# Patient Record
Sex: Female | Born: 1990 | Race: White | Hispanic: No | Marital: Single | Smoking: Current every day smoker
Health system: Southern US, Community
[De-identification: ages and names within clinical notes are randomized; demographics above are authoritative.]

---

## 2016-03-16 ENCOUNTER — Emergency Department (HOSPITAL_COMMUNITY)
Admission: EM | Admit: 2016-03-16 | Discharge: 2016-03-16 | Disposition: A | Discharge status: Home / Self Care | Payer: Medicaid Other | Attending: Emergency Medicine | Admitting: Emergency Medicine

## 2016-03-16 ENCOUNTER — Encounter (HOSPITAL_COMMUNITY): Payer: Self-pay

## 2016-03-16 ENCOUNTER — Emergency Department (HOSPITAL_COMMUNITY): Payer: Medicaid Other

## 2016-03-16 DIAGNOSIS — Y9289 Other specified places as the place of occurrence of the external cause: Secondary | ICD-10-CM | POA: Insufficient documentation

## 2016-03-16 DIAGNOSIS — Y9389 Activity, other specified: Secondary | ICD-10-CM | POA: Diagnosis not present

## 2016-03-16 DIAGNOSIS — F1110 Opioid abuse, uncomplicated: Secondary | ICD-10-CM | POA: Insufficient documentation

## 2016-03-16 DIAGNOSIS — S50851A Superficial foreign body of right forearm, initial encounter: Secondary | ICD-10-CM | POA: Insufficient documentation

## 2016-03-16 DIAGNOSIS — Y998 Other external cause status: Secondary | ICD-10-CM | POA: Diagnosis not present

## 2016-03-16 DIAGNOSIS — X58XXXA Exposure to other specified factors, initial encounter: Secondary | ICD-10-CM | POA: Diagnosis not present

## 2016-03-16 DIAGNOSIS — F17210 Nicotine dependence, cigarettes, uncomplicated: Secondary | ICD-10-CM | POA: Diagnosis not present

## 2016-03-16 MED ORDER — SULFAMETHOXAZOLE-TRIMETHOPRIM 800-160 MG PO TABS: 2.0000 | ORAL_TABLET | Freq: Two times a day (BID) | ORAL | Status: AC

## 2016-03-16 NOTE — ED Notes (Signed)
Dr Clayborne DanaMesner and Norva KarvonenH Laisure, PA, in w/pt.

## 2016-03-16 NOTE — ED Provider Notes (Signed)
Medical screening examination/treatment/procedure(s) were conducted as a shared visit with non-physician practitioner(s) and myself.  I personally evaluated the patient during the encounter.  Patient was using insulin syringe to access her cephalic vein on her right arm when the needle broke off and she came here for evaluation. Ultrasound showed a foreign body approximately 6 mm deep and 6 mm long sitting on the wall of the cephalic vein. Secondary to its proximity to the vein we will discuss case with hand surgery on call and come over the best course of action.  EMERGENCY DEPARTMENT US SOFT TISSUE INTERPRETATION "Study: Limited Soft Tissue Ultrasound"  INDICATIONS: Other (refer to comments) Multiple views of the body part were obtained in real-time with a multi-frequency linear probe PERFORMED BY:  Myself IMAGES ARCHIVED?: Yes SIDE:Right  BODY PART:Upper extremity FINDINGS: Other foreign body approximately 6 mm long on anterior portion of cephalic vein INTERPRETATION:  No abcess noted and No cellulitis noted but foreign body (likely broken needle identified)   CPT:  Upper extremity [id number]    Marily MemosJason Madyson Lukach, MD 03/16/16 2309

## 2016-03-16 NOTE — ED Notes (Signed)
Patient here with needle broken off in right arm, denies pain. Broke during heroine injection

## 2016-03-16 NOTE — Discharge Instructions (Signed)
It is important to take your antibiotics to prevent infection.  I also recommended soaking the area in warm water and applying warm compresses.    Return to the Emergency Department if you develop pus like drainage from the area, increased redness or warmth of the arm, swelling of the arm, or a fever above 101.

## 2016-03-16 NOTE — ED Notes (Signed)
Pt aware of Norva KarvonenH Laisure, PA, speaking w/Ortho MD currently.

## 2016-03-16 NOTE — ED Provider Notes (Signed)
CSN: [id number]     Arrival date & time 03/16/16  1459 History  By signing my name below, I, Linna Darner, attest that this documentation has been prepared under the direction and in the presence of non-physician practitioner, Santiago Glad, PA-C. Electronically Signed: Linna Darner, Scribe. 03/16/2016. 4:54 PM.     No chief complaint on file.    The history is provided by the patient. No language interpreter was used.     HPI Comments: Allison Harding is a 25 y.o. female who presents to the Emergency Department complaining of broken needle in her right forearm s/p heroine injection about 2 hours ago. Pt reports that she came to the ER right after the incident. Pt states that she had stopped using heroine for a while but started again two days ago after a friend brought heroine to her home. She denies fever, neuro deficits, numbness of her hands, drainage from the injection site, or any other associated symptoms.  History reviewed. No pertinent past medical history. History reviewed. No pertinent past surgical history. No family history on file. Social History  Substance Use Topics  . Smoking status: Current Every Day Smoker    Types: Cigarettes  . Smokeless tobacco: None  . Alcohol Use: None   OB History    No data available     Review of Systems  A complete 10 system review of systems was obtained and all systems are negative except as noted in the HPI and PMH.   Allergies  Review of patient's allergies indicates no known allergies.  Home Medications   Prior to Admission medications   Not on File   BP 144/95 mmHg  Pulse 114  Temp(Src) 98.6 F (37 C)  Resp 18  SpO2 98%  LMP 03/16/2016 Physical Exam  Constitutional: She is oriented to person, place, and time. She appears well-developed and well-nourished. No distress.  HENT:  Head: Normocephalic and atraumatic.  Eyes: Conjunctivae and EOM are normal.  Neck: Normal range of motion. Neck supple. No tracheal deviation  present.  Cardiovascular: Normal rate and regular rhythm.   No murmur heard. Pulmonary/Chest: Effort normal. No respiratory distress.  Musculoskeletal: Normal range of motion.  Track mark to the volar aspect of the right arm; no drainage; no edema; no warmth, no surrounding erythema. Right arm: 2+ radial pulse.  FROM right elbow and right wrist. No foreign body visualized or palpated   Neurological: She is alert and oriented to person, place, and time.  Distal sensation of the right hand intact.  Skin: Skin is warm and dry.  Psychiatric: She has a normal mood and affect. Her behavior is normal.  Nursing note and vitals reviewed.  ED Course  Procedures (including critical care time)  DIAGNOSTIC STUDIES: Oxygen Saturation is 98% on RA, normal by my interpretation.    COORDINATION OF CARE: 4:54 PM Discussed treatment plan with pt at bedside and pt agreed to plan.  Labs Review Labs Reviewed - No data to display  Imaging Review Dg Forearm Right  03/16/2016  CLINICAL DATA:  Needle broken off in right forearm. EXAM: RIGHT FOREARM - 2 VIEW COMPARISON:  None. FINDINGS: There is no evidence of fracture or other focal bone lesions. A small linear radiopaque foreign body is seen in the superficial subcutaneous tissues within the volar aspect of the midforearm. IMPRESSION: Small linear radiopaque foreign body in subcutaneous tissues in the volar aspect of the mid forearm. No osseous abnormality. Electronically Signed   By: Alver Sorrow.D.  On: 03/16/2016 16:42   I have personally reviewed and evaluated these images and lab results as part of my medical decision-making.   EKG Interpretation None      MDM   Final diagnoses:  None  Patient presents today after a needle broke off in her arm when she was injecting Heroin just prior to arrival.  Needle is 6mm.  Needle does appear to be superficial on both xray and ultrasound.  However, needle sitting on the wall of the Cephalic Vein.  Due to  the proximity of the needle to the vein, Hand Surgery consulted.  Discussed case with Dr. Mina MarbleWeingold.  He recommends starting the patient on antibiotics and warm compresses/soaks.  He will follow up with the patient in the office.  Plan discussed with patient who is in agreement with the plan.  Stable for discharge.  Return precautions given.  I personally performed the services described in this documentation, which was scribed in my presence. The recorded information has been reviewed and is accurate.   Santiago GladHeather Marlissa Emerick, PA-C 03/16/16 2016  Marily MemosJason Mesner, MD 03/16/16 [phone]

## 2016-03-16 NOTE — ED Notes (Signed)
Pt noted to be tearful - concerned d/t worrying if needle has traveled to her heart.

## 2016-03-18 ENCOUNTER — Telehealth (HOSPITAL_BASED_OUTPATIENT_CLINIC_OR_DEPARTMENT_OTHER): Payer: Self-pay | Admitting: Emergency Medicine

## 2017-05-18 IMAGING — DX DG FOREARM 2V*R*
2 series · 2 of 2 positions shown · non-contrast
Comparison: None.

CLINICAL DATA: Needle broken off in right forearm.

EXAM:
RIGHT FOREARM - 2 VIEW

[x forearm ap right]
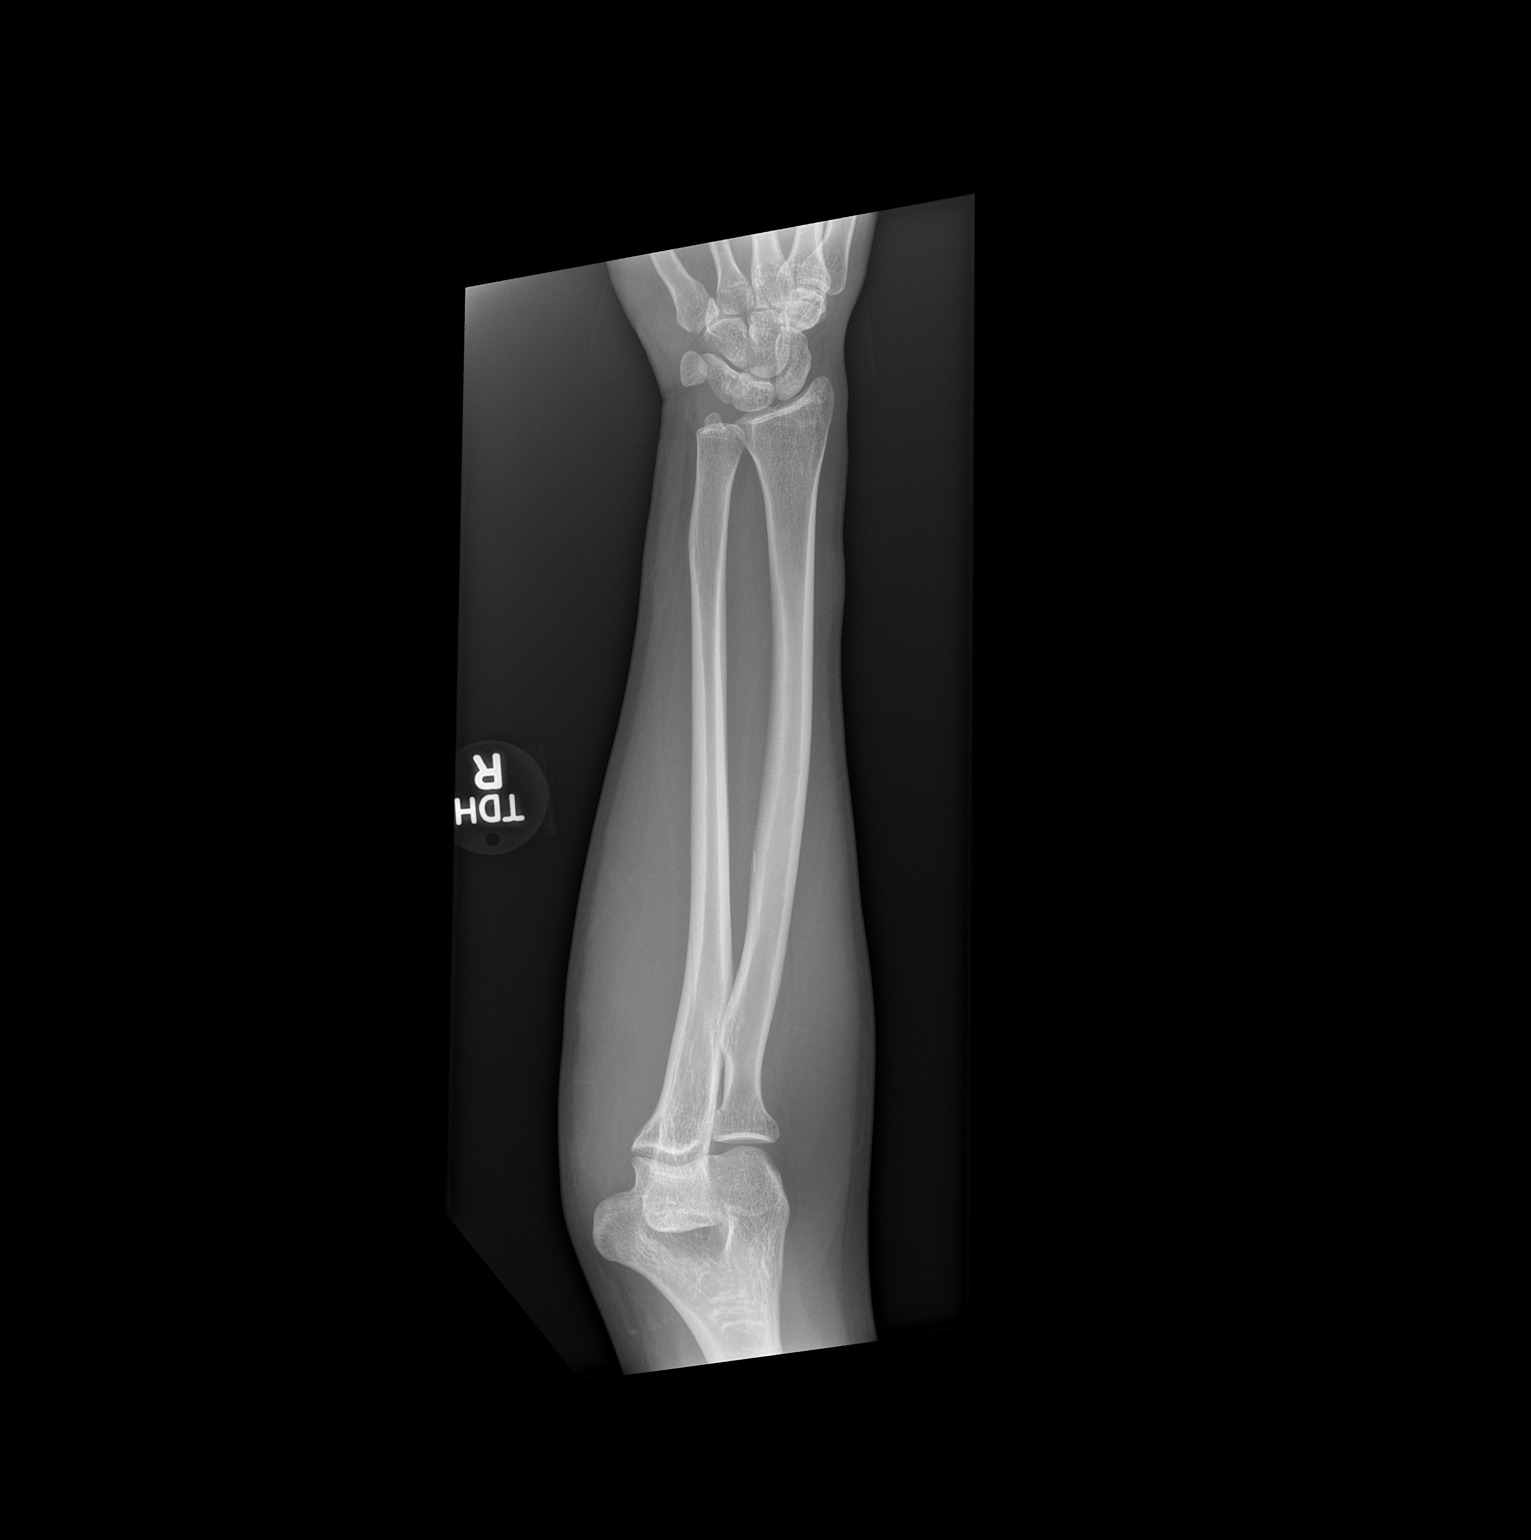

[x forearm lat right]
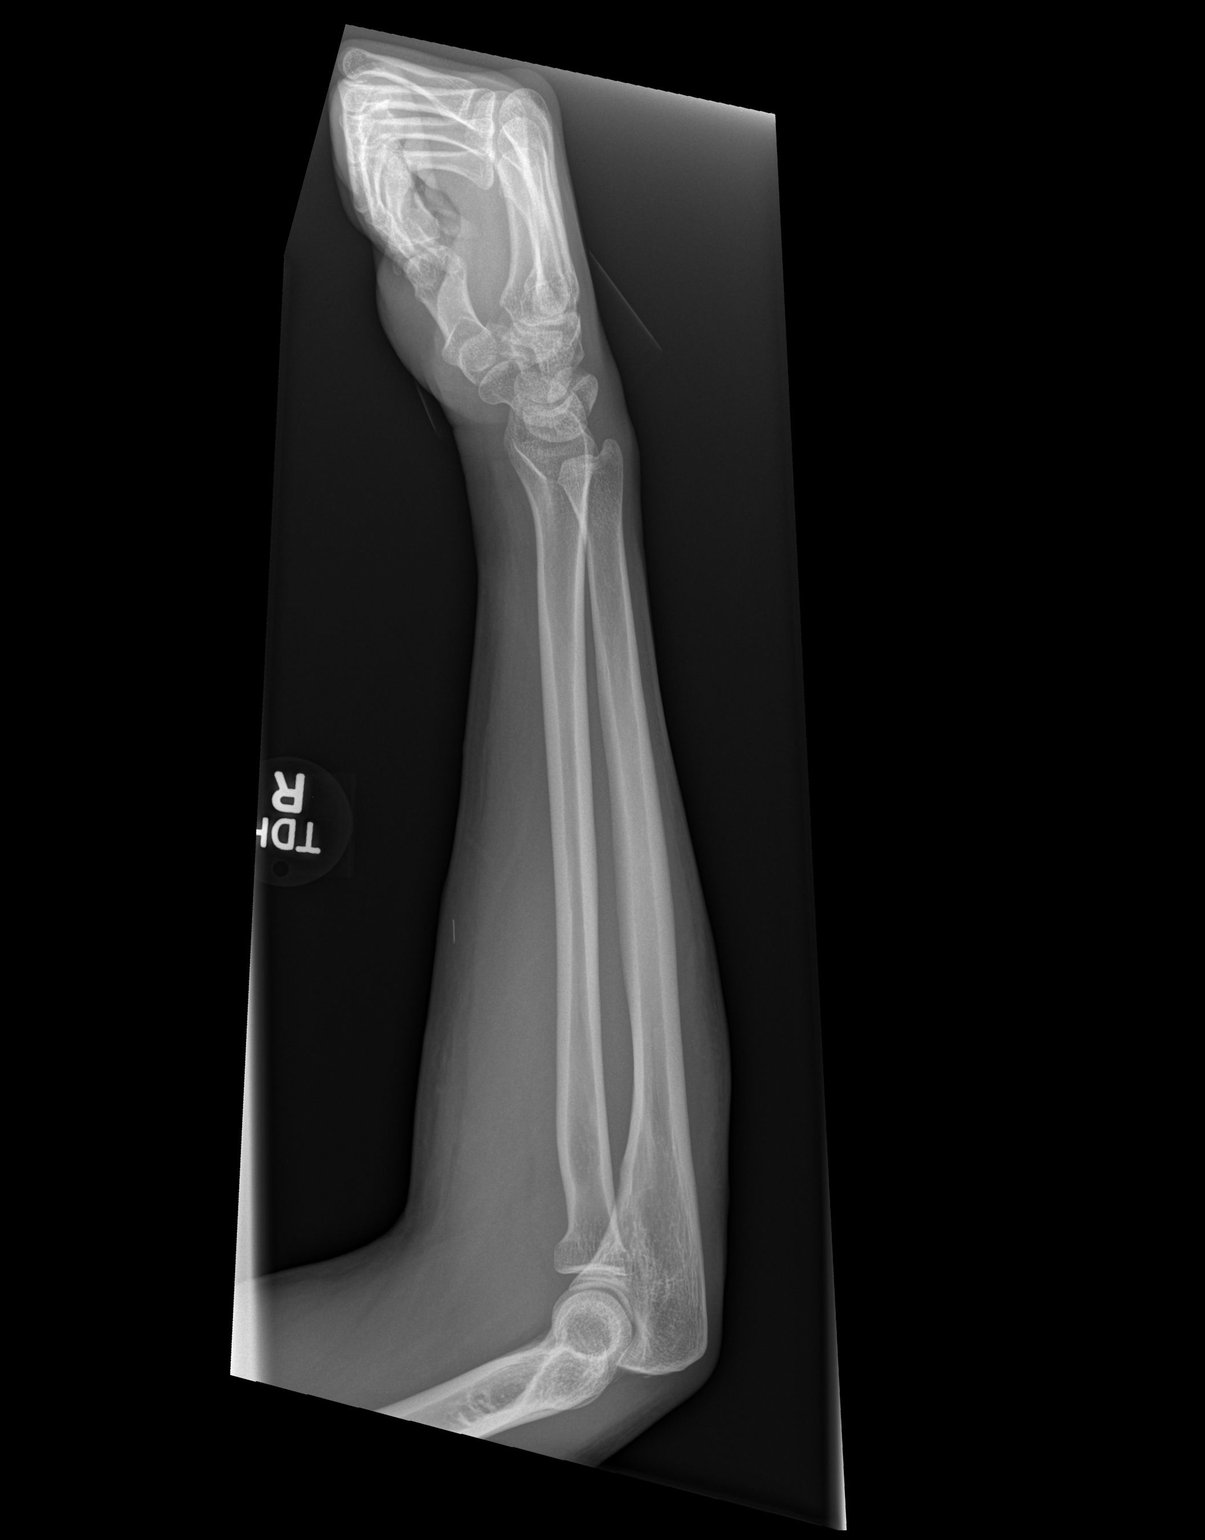

[2 of 2 positions shown; findings below may reference images not displayed]

FINDINGS: There is no evidence of fracture or other focal bone lesions. A
small linear radiopaque foreign body is seen in the superficial
subcutaneous tissues within the volar aspect of the midforearm.
IMPRESSION: Small linear radiopaque foreign body in subcutaneous tissues in the
volar aspect of the mid forearm. No osseous abnormality.

## 2022-09-13 DEATH — deceased
